# Patient Record
Sex: Female | Born: 1975 | Race: White | Hispanic: No | Marital: Married | Smoking: Current every day smoker
Health system: Southern US, Community
[De-identification: ages and names within clinical notes are randomized; demographics above are authoritative.]

## PROBLEM LIST (undated history)

## (undated) DIAGNOSIS — R87619 Unspecified abnormal cytological findings in specimens from cervix uteri: Secondary | ICD-10-CM

## (undated) DIAGNOSIS — Z8619 Personal history of other infectious and parasitic diseases: Secondary | ICD-10-CM

## (undated) HISTORY — DX: Personal history of other infectious and parasitic diseases: Z86.19

## (undated) HISTORY — DX: Unspecified abnormal cytological findings in specimens from cervix uteri: R87.619

## (undated) HISTORY — PX: DILATION AND CURETTAGE OF UTERUS: SHX78

## (undated) HISTORY — PX: WISDOM TOOTH EXTRACTION: SHX21

---

## 2015-05-16 ENCOUNTER — Encounter: Payer: Self-pay | Admitting: Obstetrics & Gynecology

## 2015-05-22 ENCOUNTER — Ambulatory Visit (INDEPENDENT_AMBULATORY_CARE_PROVIDER_SITE_OTHER): Payer: 59 | Admitting: Obstetrics & Gynecology

## 2015-05-22 ENCOUNTER — Encounter: Payer: Self-pay | Admitting: Obstetrics & Gynecology

## 2015-05-22 VITALS — BP 122/77 | HR 81 | Resp 16 | Ht 62.0 in | Wt 158.0 lb

## 2015-05-22 DIAGNOSIS — N949 Unspecified condition associated with female genital organs and menstrual cycle: Secondary | ICD-10-CM

## 2015-05-22 DIAGNOSIS — Z01419 Encounter for gynecological examination (general) (routine) without abnormal findings: Secondary | ICD-10-CM | POA: Diagnosis not present

## 2015-05-22 DIAGNOSIS — Z1151 Encounter for screening for human papillomavirus (HPV): Secondary | ICD-10-CM

## 2015-05-22 DIAGNOSIS — Z124 Encounter for screening for malignant neoplasm of cervix: Secondary | ICD-10-CM | POA: Diagnosis not present

## 2015-05-22 DIAGNOSIS — R229 Localized swelling, mass and lump, unspecified: Secondary | ICD-10-CM

## 2015-05-22 DIAGNOSIS — N9089 Other specified noninflammatory disorders of vulva and perineum: Secondary | ICD-10-CM | POA: Insufficient documentation

## 2015-05-22 DIAGNOSIS — Z30431 Encounter for routine checking of intrauterine contraceptive device: Secondary | ICD-10-CM | POA: Diagnosis not present

## 2015-05-22 DIAGNOSIS — Z113 Encounter for screening for infections with a predominantly sexual mode of transmission: Secondary | ICD-10-CM

## 2015-05-22 NOTE — Progress Notes (Signed)
  Subjective:     Christine Becker is a 39 y.o. female here for a routine exam.  Current complaints: vulvar/mons mass that causes irritation.  Patient happy with Mirena and barely spots a few times a year.  Not interested in childbearing.  History of abnormal pap in 2008.  Co testing today then q 3 years if nml.     Gynecologic History No LMP recorded. Patient is not currently having periods (Reason: IUD). Contraception: IUD Last Pap: 2014. Results were: normal Last mammogram: n/a.  Obstetric History OB History  Gravida Para Term Preterm AB SAB TAB Ectopic Multiple Living  $Remov'2 2 1 1      2    'DUYWdG$ # Outcome Date GA Lbr Len/2nd Weight Sex Delivery Anes PTL Lv  2 Preterm  [redacted]w[redacted]d  5 lb 9 oz (2.523 kg)  Vag-Spont     1 Term    7 lb 11 oz (3.487 kg)  Vag-Spont          The following portions of the patient's history were reviewed and updated as appropriate: allergies, current medications, past family history, past medical history, past social history, past surgical history and problem list.  Review of Systems A comprehensive review of systems was negative.    Objective:      Filed Vitals:   05/22/15 1504  BP: 122/77  Pulse: 81  Resp: 16  Height: $Remove'5\' 2"'qGTUCil$  (1.575 m)  Weight: 158 lb (71.668 kg)   Vitals:  WNL General appearance: alert, cooperative and no distress Becker: Normocephalic, without obvious abnormality, atraumatic Eyes: negative Throat: lips, mucosa, and tongue normal; teeth and gums normal Lungs: clear to auscultation bilaterally Breasts: normal appearance, no masses or tenderness, No nipple retraction or dimpling, No nipple discharge or bleeding Heart: regular rate and rhythm Abdomen: soft, non-tender; bowel sounds normal; no masses,  no organomegaly  Pelvic:  External Genitalia:  Tanner V, Vulvar lesion on right vulva/mons. Urethra:  No prolapse Vagina:  Pink, normal rugae, no blood or discharge Cervix:  No CMT, no lesion Uterus:  Normal size and contour, non tender Adnexa:   Normal, no masses, non tender  Extremities: no edema, redness or tenderness in the calves or thighs Skin: no lesions or rash Lymph nodes: Axillary adenopathy: none        Assessment:    Healthy female exam.   Vulvar mass, can be irritating at times fam history of breast cancer in multiple relatives   Plan:    Education reviewed: self breast exams. Contraception: IUD. Mammogram ordered. Follow up in: 3 weeks. Vulvar mass removal and BRCA testing

## 2015-05-22 NOTE — Addendum Note (Signed)
Addended by: Arne Cleveland on: 05/22/2015 04:03 PM   Modules accepted: Orders

## 2015-05-25 LAB — CYTOLOGY - PAP

## 2015-05-28 ENCOUNTER — Encounter: Payer: Self-pay | Admitting: Obstetrics & Gynecology

## 2015-05-28 DIAGNOSIS — B977 Papillomavirus as the cause of diseases classified elsewhere: Secondary | ICD-10-CM | POA: Insufficient documentation

## 2015-06-01 ENCOUNTER — Encounter: Payer: Self-pay | Admitting: *Deleted

## 2015-06-19 ENCOUNTER — Ambulatory Visit (INDEPENDENT_AMBULATORY_CARE_PROVIDER_SITE_OTHER): Payer: 59 | Admitting: Obstetrics & Gynecology

## 2015-06-19 ENCOUNTER — Encounter: Payer: Self-pay | Admitting: Obstetrics & Gynecology

## 2015-06-19 VITALS — BP 127/84 | HR 72 | Resp 16 | Ht 62.0 in | Wt 158.0 lb

## 2015-06-19 DIAGNOSIS — N949 Unspecified condition associated with female genital organs and menstrual cycle: Secondary | ICD-10-CM

## 2015-06-19 DIAGNOSIS — Z803 Family history of malignant neoplasm of breast: Secondary | ICD-10-CM | POA: Diagnosis not present

## 2015-06-19 DIAGNOSIS — N9089 Other specified noninflammatory disorders of vulva and perineum: Secondary | ICD-10-CM

## 2015-06-20 NOTE — Progress Notes (Signed)
   Subjective:    Patient ID: Christine Becker, female    DOB: 07/12/1976, 39 y.o.   MRN: [id number]  HPI  Pt presents for removal of a mass on her right mons pubis.  It intereres with shaving and wearing garmets.  It causes pain and discomfort. Pt also present for BRCA  Review of Systems  Constitutional: Negative.   Cardiovascular: Negative.   Gastrointestinal: Negative.   Genitourinary:       Pain and irritation at mons pubis  Psychiatric/Behavioral: Negative.        Objective:   Physical Exam  Constitutional: She appears well-developed and well-nourished.  Pulmonary/Chest: Effort normal.  Abdominal: Soft.  Genitourinary:     Skin: Skin is warm and dry.  Vitals reviewed.     Assessment & Plan:   39 you female for removal of mass on mons pubis  Area was cleaned with betadine 2 cc 1% lidocaine injected Scalpel used to remove mass Hemostasis achieve with 4-0 vicryl  BRCA testing

## 2015-06-26 ENCOUNTER — Telehealth: Payer: Self-pay | Admitting: *Deleted

## 2015-06-26 NOTE — Telephone Encounter (Signed)
Pt notified of pathology results

## 2015-06-26 NOTE — Telephone Encounter (Signed)
-----   Message from Lesly Dukes, MD sent at 06/25/2015  6:57 AM EDT ----- [facility].  RN TO [place] Dials

## 2015-06-29 ENCOUNTER — Telehealth: Payer: Self-pay | Admitting: *Deleted

## 2015-06-29 ENCOUNTER — Encounter: Payer: Self-pay | Admitting: *Deleted

## 2015-06-29 ENCOUNTER — Encounter: Payer: Self-pay | Admitting: Obstetrics & Gynecology

## 2015-06-29 DIAGNOSIS — Z803 Family history of malignant neoplasm of breast: Secondary | ICD-10-CM | POA: Insufficient documentation

## 2015-06-29 NOTE — Telephone Encounter (Signed)
Copy of My Risk neg results mailed to pt's home address.

## 2015-06-29 NOTE — Telephone Encounter (Signed)
-----   Message from Lesly Dukes, MD sent at 06/29/2015 11:55 AM EDT ----- My risk negative.  Rn to call patient.

## 2015-07-04 ENCOUNTER — Ambulatory Visit (INDEPENDENT_AMBULATORY_CARE_PROVIDER_SITE_OTHER): Payer: 59 | Admitting: Obstetrics & Gynecology

## 2015-07-04 ENCOUNTER — Encounter: Payer: Self-pay | Admitting: Obstetrics & Gynecology

## 2015-07-04 VITALS — BP 115/80 | HR 81 | Resp 16 | Ht 62.0 in | Wt 158.0 lb

## 2015-07-04 DIAGNOSIS — R102 Pelvic and perineal pain: Secondary | ICD-10-CM

## 2015-07-04 DIAGNOSIS — L918 Other hypertrophic disorders of the skin: Secondary | ICD-10-CM

## 2015-07-04 DIAGNOSIS — B977 Papillomavirus as the cause of diseases classified elsewhere: Secondary | ICD-10-CM | POA: Diagnosis not present

## 2015-07-04 DIAGNOSIS — N949 Unspecified condition associated with female genital organs and menstrual cycle: Secondary | ICD-10-CM

## 2015-07-04 DIAGNOSIS — N9089 Other specified noninflammatory disorders of vulva and perineum: Secondary | ICD-10-CM

## 2015-07-04 NOTE — Progress Notes (Signed)
   Subjective:    Patient ID: Christine Becker, female    DOB: 1976-07-21, 39 y.o.   MRN: [id number]  HPI  Patient here for test resutls and removal of stictch from polyp removal.  Pt has been having some pain at the site but no bleeding.  Nothing makes pain better or worse.  Pt has not tried any treatments.  There are no associated symptoms  Review of Systems  Constitutional: Negative.   Gastrointestinal: Negative.   Genitourinary: Negative.   Skin:       Pain at site of stitch, otherwise negative.       Objective:   Physical Exam  Constitutional: She appears well-developed and well-nourished. No distress.  Pulmonary/Chest: Effort normal.  Abdominal: Soft. She exhibits no distension. There is no tenderness.  Genitourinary:  Well healed incision from polyp removal   Skin: Skin is warm and dry.  Psychiatric: She has a normal mood and affect.  Vitals reviewed.         Assessment & Plan:  39 yo female with well healed site from polyp removal.  MRRISK came back negtaive.  Needs routine screening with mammogram in one month. 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 Pap in one year.

## 2015-12-20 ENCOUNTER — Other Ambulatory Visit: Payer: Self-pay | Admitting: Obstetrics & Gynecology

## 2015-12-20 DIAGNOSIS — Z1231 Encounter for screening mammogram for malignant neoplasm of breast: Secondary | ICD-10-CM

## 2016-05-20 DIAGNOSIS — Z720 Tobacco use: Secondary | ICD-10-CM | POA: Insufficient documentation

## 2016-05-20 DIAGNOSIS — K5732 Diverticulitis of large intestine without perforation or abscess without bleeding: Secondary | ICD-10-CM | POA: Insufficient documentation

## 2017-02-24 ENCOUNTER — Telehealth: Payer: Self-pay | Admitting: *Deleted

## 2017-02-24 NOTE — Telephone Encounter (Signed)
-----   Message from Lesly DukesKelly H Leggett, MD sent at 02/24/2017  2:49 PM EDT ----- Pt was to get a mammogram and Pap in 2017.  Please call patient and make sure she is getting these test somewhere.  I don't see in Epic.  Make note in chart.  Pap smear is very important for this patient given last test result.

## 2017-02-24 NOTE — Telephone Encounter (Signed)
LM on voicemail to call office

## 2017-02-27 ENCOUNTER — Encounter: Payer: Self-pay | Admitting: *Deleted

## 2017-02-27 ENCOUNTER — Telehealth: Payer: Self-pay | Admitting: *Deleted

## 2017-02-27 NOTE — Telephone Encounter (Signed)
Pt has not returned my call about her overdue pap or mammogram.  Certified letter mailed to pt .  May review in pt chart.

## 2017-06-17 ENCOUNTER — Encounter: Payer: Self-pay | Admitting: Obstetrics & Gynecology

## 2017-06-17 ENCOUNTER — Ambulatory Visit (INDEPENDENT_AMBULATORY_CARE_PROVIDER_SITE_OTHER): Payer: 59 | Admitting: Obstetrics & Gynecology

## 2017-06-17 VITALS — BP 126/88 | HR 82 | Resp 16 | Ht 62.0 in | Wt 168.0 lb

## 2017-06-17 DIAGNOSIS — Z1151 Encounter for screening for human papillomavirus (HPV): Secondary | ICD-10-CM

## 2017-06-17 DIAGNOSIS — Z30430 Encounter for insertion of intrauterine contraceptive device: Secondary | ICD-10-CM | POA: Diagnosis not present

## 2017-06-17 DIAGNOSIS — Z8619 Personal history of other infectious and parasitic diseases: Secondary | ICD-10-CM

## 2017-06-17 DIAGNOSIS — Z3202 Encounter for pregnancy test, result negative: Secondary | ICD-10-CM

## 2017-06-17 DIAGNOSIS — Z01419 Encounter for gynecological examination (general) (routine) without abnormal findings: Secondary | ICD-10-CM

## 2017-06-17 DIAGNOSIS — Z124 Encounter for screening for malignant neoplasm of cervix: Secondary | ICD-10-CM | POA: Diagnosis not present

## 2017-06-17 LAB — POCT URINE PREGNANCY: PREG TEST UR: NEGATIVE

## 2017-06-17 MED ORDER — LEVONORGESTREL 20 MCG/24HR IU IUD
INTRAUTERINE_SYSTEM | Freq: Once | INTRAUTERINE | Status: AC
  Administered 2017-06-17: 17:00:00 via INTRAUTERINE

## 2017-06-19 NOTE — Progress Notes (Signed)
Subjective:     Christine Becker is a 40 y.o. female here for a routine exam.  Current complaints: none.  Doing vey well with Mirena.  Wants another inserted today.  Pt is happy--husband just adopted her youngest child!.    Gynecologic History No LMP recorded. Patient is not currently having periods (Reason: IUD). Contraception: IUD Last Pap: 2016. Results were: normal Last mammogram: never  (BRCA negative).   Obstetric History OB History  Gravida Para Term Preterm AB Living  _0 SAB TAB Ectopic Multiple Live Births               # Outcome Date GA Lbr Len/2nd Weight Sex Delivery Anes PTL Lv  2 Preterm  [redacted]w[redacted]d 5 lb 9 oz (2.523 kg)  Vag-Spont     1 Term    7 lb 11 oz (3.487 kg)  Vag-Spont          The following portions of the patient's history were reviewed and updated as appropriate: allergies, current medications, past family history, past medical history, past social history, past surgical history and problem list.  Review of Systems Pertinent items noted in HPI and remainder of comprehensive ROS otherwise negative.    Objective:      Vitals:   06/17/17 1608  BP: 126/88  Pulse: 82  Resp: 16  Weight: 168 lb (76.2 kg)  Height: 5' 2" (1.575 m)   Vitals:  WNL General appearance: alert, cooperative and no distress  HEENT: Normocephalic, without obvious abnormality, atraumatic Eyes: negative Throat: lips, mucosa, and tongue normal; teeth and gums normal  Respiratory: Clear to auscultation bilaterally  CV: Regular rate and rhythm  Breasts:  Normal appearance, no masses or tenderness, no nipple retraction or dimpling  GI: Soft, non-tender; bowel sounds normal; no masses,  no organomegaly  GU: External Genitalia:  Tanner V, no lesion Urethra:  No prolapse   Vagina: Pink, normal rugae, no blood or discharge  Cervix: No CMT, no lesion  Uterus:  Normal size and contour, non tender  Adnexa: Normal, no masses, non tender  Musculoskeletal: No edema, redness or tenderness  in the calves or thighs  Skin: No lesions or rash  Lymphatic: Axillary adenopathy: none     Psychiatric: Normal mood and behavior        Assessment:    Healthy female exam.    Plan:    Contraception: IUD and  . Mammogram ordered. Follow up in: 6 weeks. string check   GYNECOLOGY CLINIC PROCEDURE NOTE  Christine MEspinalis a 41y.o. [id number] for Mirena IUD removal and reinsertion. No GYN concerns.  Last pap smear was on 2016 and was normal.  IUD Removal and Reinsertion  Patient identified, informed consent performed. Discussed risks of irregular bleeding, cramping, infection, malpositioning or misplacement of the IUD outside the uterus which may require further procedures. Time out was performed. Speculum placed in the vagina. Cervix visualized. Cleaned with Betadine x 2. Grasped anteriorly with a single tooth tenaculum. The strings of the IUD were grasped and pulled using ring forceps. The IUD was successfully removed in its entirety. Uterus sounded to 7 cm. Mirena IUD placed per manufacturer's recommendations. Strings trimmed to 2-3 cm. Tenaculum was removed, good hemostasis noted. Patient tolerated procedure well. Patient was given post-procedure instructions.  Patient was also asked to check IUD strings periodically and follow up in 6 weeks for IUD check.

## 2017-06-20 LAB — CYTOLOGY - PAP
Diagnosis: NEGATIVE
HPV: DETECTED — AB

## 2017-06-27 ENCOUNTER — Telehealth: Payer: Self-pay | Admitting: *Deleted

## 2017-06-27 NOTE — Telephone Encounter (Signed)
-----   Message from Lesly Dukes, MD sent at 06/24/2017  8:40 PM EDT ----- Pap is nml but HPV is positive; needs colposcopy

## 2017-06-27 NOTE — Telephone Encounter (Signed)
LM to call office concerning pap smear results.  Pt will be needing a colposcopy.

## 2018-09-08 NOTE — Progress Notes (Signed)
Pt has not kept appointment for screening mammogram for 2 years.  RN to call and see if she woul ike for us to order again.

## 2018-09-09 ENCOUNTER — Encounter: Payer: Self-pay | Admitting: *Deleted

## 2018-09-17 ENCOUNTER — Telehealth: Payer: Self-pay | Admitting: *Deleted

## 2018-09-17 NOTE — Telephone Encounter (Signed)
-----   Message from Lesly DukesKelly H Leggett, MD sent at 09/08/2018  2:26 PM EST ----- Call patient and see why she is not getting mammograms.  Reorder if she will go (note inchart, too)

## 2018-09-17 NOTE — Telephone Encounter (Signed)
Unable to reach pt via phone so letter reminding her that she is overdue for her mammogram and the importance of getting one mailed to her home address.  Encouraged pt to call if she has gotten one from another facility.

## 2018-11-24 ENCOUNTER — Other Ambulatory Visit: Payer: Self-pay | Admitting: Obstetrics & Gynecology

## 2018-11-24 DIAGNOSIS — Z1231 Encounter for screening mammogram for malignant neoplasm of breast: Secondary | ICD-10-CM

## 2018-11-27 ENCOUNTER — Ambulatory Visit (INDEPENDENT_AMBULATORY_CARE_PROVIDER_SITE_OTHER): Payer: 59

## 2018-11-27 DIAGNOSIS — Z1231 Encounter for screening mammogram for malignant neoplasm of breast: Secondary | ICD-10-CM | POA: Diagnosis not present

## 2018-12-22 ENCOUNTER — Telehealth: Payer: Self-pay | Admitting: *Deleted

## 2018-12-22 NOTE — Telephone Encounter (Signed)
Left patient message x2 to call or MyChart message the office to cancel her appointment on 12/28/2018 at 8:30am due to COVID-19.

## 2018-12-25 ENCOUNTER — Telehealth: Payer: Self-pay

## 2018-12-25 NOTE — Telephone Encounter (Signed)
Left message on pt's VM letting her know we need to cancel her annual appt on 4/6 due to COVID-19.

## 2018-12-28 ENCOUNTER — Ambulatory Visit: Payer: 59 | Admitting: Obstetrics & Gynecology

## 2019-03-11 ENCOUNTER — Telehealth: Payer: Self-pay | Admitting: *Deleted

## 2019-03-11 NOTE — Telephone Encounter (Signed)
Left patient a message to call and reschedule cancelled Annual appointment on 12/28/2018 due to COVID-19.

## 2019-06-21 ENCOUNTER — Other Ambulatory Visit: Payer: Self-pay

## 2019-06-21 ENCOUNTER — Ambulatory Visit (INDEPENDENT_AMBULATORY_CARE_PROVIDER_SITE_OTHER): Payer: 59 | Admitting: Obstetrics & Gynecology

## 2019-06-21 ENCOUNTER — Encounter: Payer: Self-pay | Admitting: Obstetrics & Gynecology

## 2019-06-21 VITALS — BP 137/94 | HR 83 | Ht 62.0 in | Wt 160.0 lb

## 2019-06-21 DIAGNOSIS — Z124 Encounter for screening for malignant neoplasm of cervix: Secondary | ICD-10-CM

## 2019-06-21 DIAGNOSIS — Z1151 Encounter for screening for human papillomavirus (HPV): Secondary | ICD-10-CM

## 2019-06-21 DIAGNOSIS — Z01419 Encounter for gynecological examination (general) (routine) without abnormal findings: Secondary | ICD-10-CM | POA: Diagnosis not present

## 2019-06-21 DIAGNOSIS — Z0000 Encounter for general adult medical examination without abnormal findings: Secondary | ICD-10-CM

## 2019-06-21 NOTE — Progress Notes (Signed)
Last pap- 06/17/17- abnormal Last mammogram- 11/30/2018- normal

## 2019-06-21 NOTE — Progress Notes (Signed)
Subjective:     Christine Becker is a 43 y.o. female here for a routine exam.  Current complaints: none.  Pt doing well with IUD.  Pt needs annual physcial exam for work, labs, and flu shot.     Gynecologic History No LMP recorded. (Menstrual status: IUD). Contraception: IUD Last Pap: 2018. Results were: abnormal--nml cytoloty and +HPV--Covid interrupted follow up in March. Last mammogram: 3/20. Results were: normal  Obstetric History OB History  Gravida Para Term Preterm AB Living  2 2 1 1   2   SAB TAB Ectopic Multiple Live Births               # Outcome Date GA Lbr Len/2nd Weight Sex Delivery Anes PTL Lv  2 Preterm  [redacted]w[redacted]d  5 lb 9 oz (2.523 kg)  Vag-Spont     1 Term    7 lb 11 oz (3.487 kg)  Vag-Spont        The following portions of the patient's history were reviewed and updated as appropriate: allergies, current medications, past family history, past medical history, past social history, past surgical history and problem list.  Review of Systems Pertinent items noted in HPI and remainder of comprehensive ROS otherwise negative.    Objective:      Vitals:   06/21/19 1348  BP: (!) 137/94  Pulse: 83  Weight: 160 lb (72.6 kg)  Height: 5\' 2"  (1.575 m)   Vitals:  WNL General appearance: alert, cooperative and no distress  HEENT: Normocephalic, without obvious abnormality, atraumatic Eyes: negative Throat: lips, mucosa, and tongue normal; teeth and gums normal  Respiratory: Clear to auscultation bilaterally  CV: Regular rate and rhythm  Breasts:  Normal appearance, no masses or tenderness, no nipple retraction or dimpling  GI: Soft, non-tender; bowel sounds normal; no masses,  no organomegaly  GU: External Genitalia:  Tanner V, no lesion Urethra:  No prolapse   Vagina: Pink, normal rugae, no blood, +white discharge  Cervix: No CMT, no lesion IUD strings felt, bleeding with pap  Uterus:  Normal size and contour, non tender  Adnexa: Normal, no masses, non tender   Musculoskeletal: No edema, redness or tenderness in the calves or thighs  Skin: No lesions or rash  Lymphatic: Axillary adenopathy: none     Psychiatric: Normal mood and behavior        Assessment:    Healthy female exam.   Yearly physcial performed today.   Plan:    Pap with co testing Mammogram yearly, my risk negative Labs--TSH, Vit D, CMP, CBC, Cholesterol panel,  Flu shot

## 2019-06-21 NOTE — Addendum Note (Signed)
Addended by: Lyndal Rainbow on: 06/21/2019 02:25 PM   Modules accepted: Orders

## 2019-06-22 ENCOUNTER — Ambulatory Visit (INDEPENDENT_AMBULATORY_CARE_PROVIDER_SITE_OTHER): Payer: 59 | Admitting: *Deleted

## 2019-06-22 DIAGNOSIS — Z01419 Encounter for gynecological examination (general) (routine) without abnormal findings: Secondary | ICD-10-CM

## 2019-06-22 DIAGNOSIS — Z23 Encounter for immunization: Secondary | ICD-10-CM | POA: Diagnosis not present

## 2019-06-22 NOTE — Progress Notes (Signed)
Pt here for fasting labs and Flu vaccine only

## 2019-06-23 ENCOUNTER — Other Ambulatory Visit: Payer: Self-pay | Admitting: Obstetrics & Gynecology

## 2019-06-23 ENCOUNTER — Encounter: Payer: Self-pay | Admitting: Obstetrics & Gynecology

## 2019-06-23 DIAGNOSIS — E559 Vitamin D deficiency, unspecified: Secondary | ICD-10-CM | POA: Insufficient documentation

## 2019-06-23 LAB — COMPREHENSIVE METABOLIC PANEL
AG Ratio: 1.9 (calc) (ref 1.0–2.5)
ALT: 13 U/L (ref 6–29)
AST: 14 U/L (ref 10–30)
Albumin: 4.2 g/dL (ref 3.6–5.1)
Alkaline phosphatase (APISO): 87 U/L (ref 31–125)
BUN: 7 mg/dL (ref 7–25)
CO2: 26 mmol/L (ref 20–32)
Calcium: 9.6 mg/dL (ref 8.6–10.2)
Chloride: 104 mmol/L (ref 98–110)
Creat: 0.81 mg/dL (ref 0.50–1.10)
Globulin: 2.2 g/dL (calc) (ref 1.9–3.7)
Glucose, Bld: 82 mg/dL (ref 65–99)
Potassium: 4.7 mmol/L (ref 3.5–5.3)
Sodium: 136 mmol/L (ref 135–146)
Total Bilirubin: 0.7 mg/dL (ref 0.2–1.2)
Total Protein: 6.4 g/dL (ref 6.1–8.1)

## 2019-06-23 LAB — CBC
HCT: 45.4 % — ABNORMAL HIGH (ref 35.0–45.0)
Hemoglobin: 15.2 g/dL (ref 11.7–15.5)
MCH: 32.8 pg (ref 27.0–33.0)
MCHC: 33.5 g/dL (ref 32.0–36.0)
MCV: 98.1 fL (ref 80.0–100.0)
MPV: 11.1 fL (ref 7.5–12.5)
Platelets: 270 10*3/uL (ref 140–400)
RBC: 4.63 10*6/uL (ref 3.80–5.10)
RDW: 12.1 % (ref 11.0–15.0)
WBC: 5.6 10*3/uL (ref 3.8–10.8)

## 2019-06-23 LAB — LIPID PANEL
Cholesterol: 139 mg/dL (ref <200)
HDL: 49 mg/dL — ABNORMAL LOW (ref >=50)
LDL Cholesterol (Calc): 77 mg/dL (calc)
Non-HDL Cholesterol (Calc): 90 mg/dL (calc) (ref <130)
Total CHOL/HDL Ratio: 2.8 (calc) (ref <5.0)
Triglycerides: 58 mg/dL (ref <150)

## 2019-06-23 LAB — TSH: TSH: 2.15 mIU/L

## 2019-06-23 LAB — VITAMIN D 25 HYDROXY (VIT D DEFICIENCY, FRACTURES): Vit D, 25-Hydroxy: 17 ng/mL — ABNORMAL LOW (ref 30–100)

## 2019-06-23 MED ORDER — VITAMIN D (ERGOCALCIFEROL) 1.25 MG (50000 UNIT) PO CAPS: 50000.0000 [IU] | ORAL_CAPSULE | ORAL | 1 refills | Status: DC

## 2019-06-23 NOTE — Progress Notes (Signed)
Vitamin D 50K units weekly for 6 weeks and redraw level.

## 2019-06-28 LAB — CYTOLOGY - PAP
Diagnosis: NEGATIVE
HPV 16: NEGATIVE
HPV 18 / 45: NEGATIVE
High risk HPV: POSITIVE — AB

## 2019-07-05 ENCOUNTER — Encounter: Payer: Self-pay | Admitting: Obstetrics & Gynecology

## 2019-07-05 ENCOUNTER — Ambulatory Visit (INDEPENDENT_AMBULATORY_CARE_PROVIDER_SITE_OTHER): Payer: 59 | Admitting: Obstetrics & Gynecology

## 2019-07-05 ENCOUNTER — Other Ambulatory Visit: Payer: Self-pay

## 2019-07-05 VITALS — BP 135/87 | HR 77 | Resp 16 | Ht 62.0 in | Wt 161.0 lb

## 2019-07-05 DIAGNOSIS — N870 Mild cervical dysplasia: Secondary | ICD-10-CM

## 2019-07-05 DIAGNOSIS — Z3202 Encounter for pregnancy test, result negative: Secondary | ICD-10-CM | POA: Diagnosis not present

## 2019-07-05 DIAGNOSIS — R87810 Cervical high risk human papillomavirus (HPV) DNA test positive: Secondary | ICD-10-CM

## 2019-07-05 LAB — POCT URINE PREGNANCY: Preg Test, Ur: NEGATIVE

## 2019-07-05 NOTE — Progress Notes (Signed)
Colposcopy Procedure Note  Indications: Pap smear 1 months ago showed: ASCUS with POSITIVE high risk HPV. The prior pap showed ASCUS with POSITIVE high risk HPV.   Pt was to have colposcopy in 2018 but did not call back to schedule (VM left by RN clark on 06/27/17).  Rpt pap in Septemer 2020 showed nml cytology with +HPV.  Pt here for colposcopy today.  Prior cervical/vaginal disease: none other than above. Prior cervical treatment: no treatment.  Procedure Details  The risks and benefits of the procedure and Written informed consent obtained.  Speculum placed in vagina and excellent visualization of cervix achieved, cervix swabbed x 3 with acetic acid solution.  Findings: Cervix: acetowhite lesion(s) noted at 5, 7, 10, and 1 o'clock; cervix swabbed with Lugol's solution, SCJ visualized - lesion at 5, 7, 10, and 1 o'clock and endocervical curettage performed. Vaginal inspection: vaginal colposcopy not performed. Vulvar colposcopy: vulvar colposcopy not performed.  Specimens: 5, 7, 10, and 1 o'clock biopsies and ECC  Complications: none.  Plan: Specimens labelled and sent to Pathology. Will base further treatment on Pathology findings. Post biopsy instructions given to patient.

## 2019-07-15 ENCOUNTER — Encounter: Payer: Self-pay | Admitting: *Deleted

## 2020-04-05 ENCOUNTER — Other Ambulatory Visit: Payer: Self-pay | Admitting: Obstetrics & Gynecology

## 2020-04-05 DIAGNOSIS — Z1231 Encounter for screening mammogram for malignant neoplasm of breast: Secondary | ICD-10-CM

## 2020-04-06 ENCOUNTER — Other Ambulatory Visit: Payer: Self-pay

## 2020-04-06 ENCOUNTER — Ambulatory Visit (INDEPENDENT_AMBULATORY_CARE_PROVIDER_SITE_OTHER): Payer: 59

## 2020-04-06 DIAGNOSIS — Z1231 Encounter for screening mammogram for malignant neoplasm of breast: Secondary | ICD-10-CM | POA: Diagnosis not present

## 2020-07-17 ENCOUNTER — Encounter: Payer: Self-pay | Admitting: Obstetrics & Gynecology

## 2020-07-17 ENCOUNTER — Ambulatory Visit (INDEPENDENT_AMBULATORY_CARE_PROVIDER_SITE_OTHER): Payer: 59 | Admitting: Obstetrics & Gynecology

## 2020-07-17 ENCOUNTER — Other Ambulatory Visit: Payer: Self-pay

## 2020-07-17 ENCOUNTER — Other Ambulatory Visit (HOSPITAL_COMMUNITY)
Admission: RE | Admit: 2020-07-17 | Discharge: 2020-07-17 | Disposition: A | Discharge status: Home / Self Care | Payer: 59 | Source: Ambulatory Visit | Attending: Obstetrics & Gynecology | Admitting: Obstetrics & Gynecology

## 2020-07-17 VITALS — BP 149/99 | HR 85 | Ht 62.0 in | Wt 165.0 lb

## 2020-07-17 DIAGNOSIS — Z01419 Encounter for gynecological examination (general) (routine) without abnormal findings: Secondary | ICD-10-CM

## 2020-07-17 NOTE — Progress Notes (Signed)
Last pap- 06/21/19- Negative with High Risk HPV Last mammogram- 04/06/20- normal Pt has appt with PCP on Wednesday- will address BP

## 2020-07-17 NOTE — Progress Notes (Signed)
Subjective:     Christine Becker is a 44 y.o. female here for a routine exam.  Current complaints: none.  Happy with IUD Gynecologic History No LMP recorded. (Menstrual status: IUD). Contraception: IUD Last Pap: 2020. Results were: abnormal Last mammogram: 03/2020. Results were: normal  Obstetric History OB History  Gravida Para Term Preterm AB Living  2 2 1 1   2   SAB TAB Ectopic Multiple Live Births               # Outcome Date GA Lbr Len/2nd Weight Sex Delivery Anes PTL Lv  2 Preterm  [redacted]w[redacted]d  5 lb 9 oz (2.523 kg)  Vag-Spont     1 Term    7 lb 11 oz (3.487 kg)  Vag-Spont        The following portions of the patient's history were reviewed and updated as appropriate: allergies, current medications, past family history, past medical history, past social history, past surgical history and problem list.  Review of Systems Pertinent items noted in HPI and remainder of comprehensive ROS otherwise negative.    Objective:      Vitals:   07/17/20 1543  BP: (!) 149/99  Pulse: 85  Weight: 165 lb (74.8 kg)  Height: 5\' 2"  (1.575 m)   Vitals:  WNL General appearance: alert, cooperative and no distress  HEENT: Normocephalic, without obvious abnormality, atraumatic Eyes: negative Throat: lips, mucosa, and tongue normal; teeth and gums normal  Respiratory: Clear to auscultation bilaterally  CV: Regular rate and rhythm  Breasts:  Normal appearance, no masses or tenderness, no nipple retraction or dimpling  GI: Soft, non-tender; bowel sounds normal; no masses,  no organomegaly  GU: External Genitalia:  Tanner V, no lesion Urethra:  No prolapse   Vagina: Pink, normal rugae, scant blood coming from cervix  Cervix: No CMT, no lesion, IUD strings seen  Uterus:  Normal size and contour, non tender  Adnexa: Normal, no masses, non tender  Musculoskeletal: No edema, redness or tenderness in the calves or thighs  Skin: No lesions or rash  Lymphatic: Axillary adenopathy: none     Psychiatric:  Normal mood and behavior        Assessment:    Healthy female exam.   CIN on Pap last year   Plan:   1.  Pap with cotesting 2.  Yearly mammograms 3.  Reviewed new colon screening guidelines 4.  Smoking cessation 5.  Has appt with PCP tomorrow for HTN.

## 2020-07-19 LAB — CYTOLOGY - PAP
Comment: NEGATIVE
Diagnosis: NEGATIVE
High risk HPV: NEGATIVE

## 2020-07-20 ENCOUNTER — Ambulatory Visit: Payer: 59 | Admitting: Family Medicine

## 2020-07-20 MED ORDER — HYDROMORPHONE HCL 1 MG/ML IJ SOLN
INTRAMUSCULAR | Status: AC
  Filled 2020-07-20: qty 1

## 2022-01-14 ENCOUNTER — Other Ambulatory Visit (HOSPITAL_BASED_OUTPATIENT_CLINIC_OR_DEPARTMENT_OTHER): Payer: Self-pay | Admitting: Obstetrics & Gynecology

## 2022-01-14 DIAGNOSIS — Z1231 Encounter for screening mammogram for malignant neoplasm of breast: Secondary | ICD-10-CM

## 2022-01-17 ENCOUNTER — Ambulatory Visit (INDEPENDENT_AMBULATORY_CARE_PROVIDER_SITE_OTHER): Payer: 59

## 2022-01-17 DIAGNOSIS — Z1231 Encounter for screening mammogram for malignant neoplasm of breast: Secondary | ICD-10-CM | POA: Diagnosis not present

## 2022-02-22 ENCOUNTER — Encounter: Payer: Self-pay | Admitting: Family Medicine

## 2022-02-22 ENCOUNTER — Ambulatory Visit (INDEPENDENT_AMBULATORY_CARE_PROVIDER_SITE_OTHER): Payer: 59 | Admitting: Family Medicine

## 2022-02-22 DIAGNOSIS — Z720 Tobacco use: Secondary | ICD-10-CM

## 2022-02-22 MED ORDER — CHANTIX STARTING MONTH PAK 0.5 MG X 11 & 1 MG X 42 PO TBPK: ORAL_TABLET | ORAL | 0 refills | Status: DC

## 2022-02-22 MED ORDER — VARENICLINE TARTRATE 1 MG PO TABS: 1.0000 mg | ORAL_TABLET | Freq: Two times a day (BID) | ORAL | 2 refills | Status: DC

## 2022-02-22 NOTE — Patient Instructions (Signed)

## 2022-02-24 NOTE — Assessment & Plan Note (Signed)
We discussed options for smoking cessation.  All of those options she is interested in trying Chantix.  Starter pack as well as continuing pack sent in.  Side effects as well as black box warning of potential for suicidal ideation reviewed.

## 2022-02-24 NOTE — Progress Notes (Signed)
Christine Becker - 46 y.o. female MRN [id number]  Date of birth: 02/04/1976  Subjective Chief Complaint  Patient presents with   Establish Care    HPI Christine Becker is a 46 year old female here today for initial visit to establish care.  Overall she has been in fairly good health.  She is seeing GYN on a regular basis and has had history of abnormal Pap smear.  She is a daily smoker which increased after the passing of her father last year.  She is interested in quitting.  She does feel like she has been able to take time to process the death of her father over the past year and now is a good time to try to quit.  She has tried patches in the past without success.  She has never tried any prescription medications.  ROS:  A comprehensive ROS was completed and negative except as noted per HPI  No Known Allergies  Past Medical History:  Diagnosis Date   Abnormal Pap smear of cervix    repeat pap WNL   History of HPV infection     Past Surgical History:  Procedure Laterality Date   DILATION AND CURETTAGE OF UTERUS     bleeding   WISDOM TOOTH EXTRACTION      Social History   Socioeconomic History   Marital status: Married    Spouse name: Not on file   Number of children: Not on file   Years of education: Not on file   Highest education level: Not on file  Occupational History   Occupation: HR  Tobacco Use   Smoking status: Every Day    Packs/day: 0.50    Years: 10.00    Pack years: 5.00    Types: Cigarettes   Smokeless tobacco: Never  Substance and Sexual Activity   Alcohol use: Yes   Drug use: No   Sexual activity: Yes    Partners: Male    Birth control/protection: I.U.D.  Other Topics Concern   Not on file  Social History Narrative   Not on file   Social Determinants of Health   Financial Resource Strain: Not on file  Food Insecurity: Not on file  Transportation Needs: Not on file  Physical Activity: Not on file  Stress: Not on file  Social Connections: Not on file     Family History  Problem Relation Age of Onset   Diabetes Father    Heart failure Father    Obesity Father    Hyperlipidemia Mother    Obesity Mother    Cancer Paternal Grandmother        breast   Breast cancer Paternal Grandmother    Stroke Paternal Grandmother    Cancer Paternal Aunt        breast   Cancer Paternal Aunt        breast   Cancer Paternal Aunt        breast   Cancer [place] American cousin   Breast cancer Cousin     Health Maintenance  Topic Date Due   HIV Screening  Never done   Hepatitis C Screening  Never done   COVID-19 Vaccine (3 - Booster for [place] series) 03/20/2020   COLONOSCOPY (Pts 45-13yrs Insurance coverage will need to be confirmed)  Never done   INFLUENZA VACCINE  04/23/2022   PAP SMEAR-Modifier  07/18/2023   TETANUS/TDAP  05/20/2026   HPV VACCINES  Aged Out     ----------------------------------------------------------------------------------------------------------------------------------------------------------------------------------------------------------------- Physical Exam BP  125/84 (BP Location: Left Arm, Patient Position: Sitting, Cuff Size: Normal)   Pulse (!) 102   Ht 5' 1.75" (1.568 m)   Wt 181 lb (82.1 kg)   SpO2 96%   BMI 33.37 kg/m   Physical Exam Constitutional:      Appearance: Normal appearance.  Eyes:     General: No scleral icterus. Cardiovascular:     Rate and Rhythm: Normal rate and regular rhythm.  Pulmonary:     Effort: Pulmonary effort is normal.     Breath sounds: Normal breath sounds. No rales.  Musculoskeletal:     Cervical back: Neck supple.  Neurological:     General: No focal deficit present.     Mental Status: She is alert.  Psychiatric:        Mood and Affect: Mood normal.        Behavior: Behavior normal.     ------------------------------------------------------------------------------------------------------------------------------------------------------------------------------------------------------------------- Assessment and Plan  Tobacco use We discussed options for smoking cessation.  All of those options she is interested in trying Chantix.  Starter pack as well as continuing pack sent in.  Side effects as well as black box warning of potential for suicidal ideation reviewed.     Meds ordered this encounter  Medications   Varenicline Tartrate, Starter, (CHANTIX STARTING MONTH PAK) 0.5 MG X 11 & 1 MG X 42 TBPK    Sig: Take as directed on packaging.    Dispense:  53 each    Refill:  0   varenicline (CHANTIX CONTINUING MONTH PAK) 1 MG tablet    Sig: Take 1 tablet (1 mg total) by mouth 2 (two) times daily. Continue after completion of starter pack    Dispense:  60 tablet    Refill:  2    Return for Please schedule for annual exam/Fasting labs.    This visit occurred during the SARS-CoV-2 public health emergency.  Safety protocols were in place, including screening questions prior to the visit, additional usage of staff PPE, and extensive cleaning of exam room while observing appropriate contact time as indicated for disinfecting solutions.

## 2022-04-22 ENCOUNTER — Other Ambulatory Visit: Payer: 59

## 2022-04-22 ENCOUNTER — Other Ambulatory Visit: Payer: Self-pay | Admitting: Family Medicine

## 2022-04-22 DIAGNOSIS — E559 Vitamin D deficiency, unspecified: Secondary | ICD-10-CM

## 2022-04-22 DIAGNOSIS — Z0000 Encounter for general adult medical examination without abnormal findings: Secondary | ICD-10-CM

## 2022-04-22 DIAGNOSIS — Z13220 Encounter for screening for lipoid disorders: Secondary | ICD-10-CM

## 2022-04-23 LAB — CBC WITH DIFFERENTIAL/PLATELET
Absolute Monocytes: 407 cells/uL (ref 200–950)
Basophils Absolute: 37 cells/uL (ref 0–200)
Basophils Relative: 0.5 %
Eosinophils Absolute: 126 cells/uL (ref 15–500)
Eosinophils Relative: 1.7 %
HCT: 43.2 % (ref 35.0–45.0)
Hemoglobin: 14.5 g/dL (ref 11.7–15.5)
Lymphs Abs: 1524 cells/uL (ref 850–3900)
MCH: 33 pg (ref 27.0–33.0)
MCHC: 33.6 g/dL (ref 32.0–36.0)
MCV: 98.4 fL (ref 80.0–100.0)
MPV: 10.4 fL (ref 7.5–12.5)
Monocytes Relative: 5.5 %
Neutro Abs: 5306 cells/uL (ref 1500–7800)
Neutrophils Relative %: 71.7 %
Platelets: 297 10*3/uL (ref 140–400)
RBC: 4.39 10*6/uL (ref 3.80–5.10)
RDW: 12.4 % (ref 11.0–15.0)
Total Lymphocyte: 20.6 %
WBC: 7.4 10*3/uL (ref 3.8–10.8)

## 2022-04-23 LAB — LIPID PANEL W/REFLEX DIRECT LDL
Cholesterol: 157 mg/dL (ref <200)
HDL: 44 mg/dL — ABNORMAL LOW (ref >=50)
LDL Cholesterol (Calc): 92 mg/dL (calc)
Non-HDL Cholesterol (Calc): 113 mg/dL (calc) (ref <130)
Total CHOL/HDL Ratio: 3.6 (calc) (ref <5.0)
Triglycerides: 110 mg/dL (ref <150)

## 2022-04-23 LAB — COMPLETE METABOLIC PANEL WITH GFR
AG Ratio: 1.6 (calc) (ref 1.0–2.5)
ALT: 14 U/L (ref 6–29)
AST: 12 U/L (ref 10–35)
Albumin: 4 g/dL (ref 3.6–5.1)
Alkaline phosphatase (APISO): 91 U/L (ref 31–125)
BUN: 12 mg/dL (ref 7–25)
CO2: 26 mmol/L (ref 20–32)
Calcium: 9.3 mg/dL (ref 8.6–10.2)
Chloride: 105 mmol/L (ref 98–110)
Creat: 0.85 mg/dL (ref 0.50–0.99)
Globulin: 2.5 g/dL (calc) (ref 1.9–3.7)
Glucose, Bld: 139 mg/dL (ref 65–139)
Potassium: 4.4 mmol/L (ref 3.5–5.3)
Sodium: 135 mmol/L (ref 135–146)
Total Bilirubin: 0.4 mg/dL (ref 0.2–1.2)
Total Protein: 6.5 g/dL (ref 6.1–8.1)
eGFR: 86 mL/min/{1.73_m2} (ref >=60)

## 2022-04-23 LAB — VITAMIN D 25 HYDROXY (VIT D DEFICIENCY, FRACTURES): Vit D, 25-Hydroxy: 28 ng/mL — ABNORMAL LOW (ref 30–100)

## 2022-04-23 LAB — TSH: TSH: 1.8 mIU/L

## 2022-04-24 ENCOUNTER — Encounter: Payer: 59 | Admitting: Family Medicine

## 2022-05-01 ENCOUNTER — Encounter: Payer: Self-pay | Admitting: Family Medicine

## 2022-05-01 ENCOUNTER — Ambulatory Visit (INDEPENDENT_AMBULATORY_CARE_PROVIDER_SITE_OTHER): Payer: 59 | Admitting: Family Medicine

## 2022-05-01 VITALS — BP 135/86 | HR 64 | Ht 61.75 in | Wt 183.0 lb

## 2022-05-01 DIAGNOSIS — Z0000 Encounter for general adult medical examination without abnormal findings: Secondary | ICD-10-CM | POA: Diagnosis not present

## 2022-05-01 DIAGNOSIS — Z1211 Encounter for screening for malignant neoplasm of colon: Secondary | ICD-10-CM | POA: Diagnosis not present

## 2022-05-01 MED ORDER — CHANTIX STARTING MONTH PAK 0.5 MG X 11 & 1 MG X 42 PO TBPK: ORAL_TABLET | ORAL | 0 refills | Status: AC

## 2022-05-01 MED ORDER — VARENICLINE TARTRATE 1 MG PO TABS: 1.0000 mg | ORAL_TABLET | Freq: Two times a day (BID) | ORAL | 2 refills | Status: AC

## 2022-05-01 NOTE — Progress Notes (Signed)
Christine Becker - 46 y.o. female MRN [id number]  Date of birth: June 16, 1976  Subjective No chief complaint on file.   HPI Christine Becker is a 46 year old female here today for annual exam.  She had labs completed prior to visit which we reviewed in detail today.  She does try to stay pretty active.  Feels like diet is pretty good most of the time.  Unfortunately she does continue to smoke.  She is interested in starting Chantix.  Would like this sent to her new pharmacy.  Tried to pick this up previously but was out of stock.  She does consume alcohol occasionally.    Immunizations are up-to-date.  She has never had colon cancer screening.  She is interested in Cologuard.  Review of Systems  Constitutional:  Negative for chills, fever, malaise/fatigue and weight loss.  HENT:  Negative for congestion, ear pain and sore throat.   Eyes:  Negative for blurred vision, double vision and pain.  Respiratory:  Negative for cough and shortness of breath.   Cardiovascular:  Negative for chest pain and palpitations.  Gastrointestinal:  Negative for abdominal pain, blood in stool, constipation, heartburn and nausea.  Genitourinary:  Negative for dysuria and urgency.  Musculoskeletal:  Negative for joint pain and myalgias.  Neurological:  Negative for dizziness and headaches.  Endo/Heme/Allergies:  Does not bruise/bleed easily.  Psychiatric/Behavioral:  Negative for depression. The patient is not nervous/anxious and does not have insomnia.       No Known Allergies  Past Medical History:  Diagnosis Date   Abnormal Pap smear of cervix    repeat pap WNL   History of HPV infection     Past Surgical History:  Procedure Laterality Date   DILATION AND CURETTAGE OF UTERUS     bleeding   WISDOM TOOTH EXTRACTION      Social History   Socioeconomic History   Marital status: Married    Spouse name: Not on file   Number of children: Not on file   Years of education: Not on file   Highest education  level: Not on file  Occupational History   Occupation: HR  Tobacco Use   Smoking status: Every Day    Packs/day: 0.50    Years: 10.00    Total pack years: 5.00    Types: Cigarettes   Smokeless tobacco: Never  Substance and Sexual Activity   Alcohol use: Yes   Drug use: No   Sexual activity: Yes    Partners: Male    Birth control/protection: I.U.D.  Other Topics Concern   Not on file  Social History Narrative   Not on file   Social Determinants of Health   Financial Resource Strain: Not on file  Food Insecurity: Not on file  Transportation Needs: Not on file  Physical Activity: Not on file  Stress: Not on file  Social Connections: Not on file    Family History  Problem Relation Age of Onset   Diabetes Father    Heart failure Father    Obesity Father    Hyperlipidemia Mother    Obesity Mother    Cancer Paternal Grandmother        breast   Breast cancer Paternal Grandmother    Stroke Paternal Grandmother    Cancer Paternal Aunt        breast   Cancer Paternal Aunt        breast   Cancer Paternal Aunt        breast   Cancer  Christine Becker cousin   Breast cancer Cousin     Health Maintenance  Topic Date Due   HIV Screening  Never done   Hepatitis C Screening  Never done   COVID-19 Vaccine (3 - Pfizer series) 03/20/2020   COLONOSCOPY (Pts 45-30yrs Insurance coverage will need to be confirmed)  Never done   INFLUENZA VACCINE  04/23/2022   PAP SMEAR-Modifier  07/18/2023   TETANUS/TDAP  05/20/2026   HPV VACCINES  Aged Out     ----------------------------------------------------------------------------------------------------------------------------------------------------------------------------------------------------------------- Physical Exam BP 135/86 (BP Location: Left Arm, Patient Position: Sitting, Cuff Size: Normal)   Pulse 64   Ht 5' 1.75" (1.568 m)   Wt 183 lb (83 kg)   SpO2 96%   BMI 33.74 kg/m   Physical Exam Constitutional:       General: She is not in acute distress. HENT:     Head: Normocephalic and atraumatic.     Right Ear: Tympanic membrane and ear canal normal.     Left Ear: Tympanic membrane and ear canal normal.     Nose: Nose normal.  Eyes:     General: No scleral icterus.    Conjunctiva/sclera: Conjunctivae normal.  Neck:     Thyroid: No thyromegaly.  Cardiovascular:     Rate and Rhythm: Normal rate and regular rhythm.     Heart sounds: Normal heart sounds.  Pulmonary:     Effort: Pulmonary effort is normal.     Breath sounds: Normal breath sounds.  Abdominal:     General: Bowel sounds are normal. There is no distension.     Palpations: Abdomen is soft.     Tenderness: There is no abdominal tenderness. There is no guarding.  Musculoskeletal:        General: Normal range of motion.     Cervical back: Normal range of motion and neck supple.  Lymphadenopathy:     Cervical: No cervical adenopathy.  Skin:    General: Skin is warm and dry.     Findings: No rash.  Neurological:     General: No focal deficit present.     Mental Status: She is alert and oriented to person, place, and time.     Cranial Nerves: No cranial nerve deficit.     Coordination: Coordination normal.  Psychiatric:        Mood and Affect: Mood normal.        Behavior: Behavior normal.     ------------------------------------------------------------------------------------------------------------------------------------------------------------------------------------------------------------------- Assessment and Plan  Well adult exam Well adult Recent labs reviewed with her. Screenings: Cologuard ordered Immunizations: Up-to-date Anticipatory guidance/risk factor reduction: Counseled on smoking cessation.  Updated prescription for Chantix sent in.   Meds ordered this encounter  Medications   Varenicline Tartrate, Starter, (CHANTIX STARTING MONTH PAK) 0.5 MG X 11 & 1 MG X 42 TBPK    Sig: Take as directed on packaging.     Dispense:  53 each    Refill:  0   varenicline (CHANTIX CONTINUING MONTH PAK) 1 MG tablet    Sig: Take 1 tablet (1 mg total) by mouth 2 (two) times daily. Continue after completion of starter pack    Dispense:  60 tablet    Refill:  2    No follow-ups on file.    This visit occurred during the SARS-CoV-2 public health emergency.  Safety protocols were in place, including screening questions prior to the visit, additional usage of staff PPE, and extensive cleaning of exam room while observing appropriate  contact time as indicated for disinfecting solutions.

## 2022-05-01 NOTE — Assessment & Plan Note (Signed)
Well adult Recent labs reviewed with her. Screenings: Cologuard ordered Immunizations: Up-to-date Anticipatory guidance/risk factor reduction: Counseled on smoking cessation.  Updated prescription for Chantix sent in.

## 2022-05-01 NOTE — Patient Instructions (Signed)
Preventive Care 40-47 Years Old, Female Preventive care refers to lifestyle choices and visits with your health care provider that can promote health and wellness. Preventive care visits are also called wellness exams. What can I expect for my preventive care visit? Counseling Your health care provider may ask you questions about your: Medical history, including: Past medical problems. Family medical history. Pregnancy history. Current health, including: Menstrual cycle. Method of birth control. Emotional well-being. Home life and relationship well-being. Sexual activity and sexual health. Lifestyle, including: Alcohol, nicotine or tobacco, and drug use. Access to firearms. Diet, exercise, and sleep habits. Work and work environment. Sunscreen use. Safety issues such as seatbelt and bike helmet use. Physical exam Your health care provider will check your: Height and weight. These may be used to calculate your BMI (body mass index). BMI is a measurement that tells if you are at a healthy weight. Waist circumference. This measures the distance around your waistline. This measurement also tells if you are at a healthy weight and may help predict your risk of certain diseases, such as type 2 diabetes and high blood pressure. Heart rate and blood pressure. Body temperature. Skin for abnormal spots. What immunizations do I need?  Vaccines are usually given at various ages, according to a schedule. Your health care provider will recommend vaccines for you based on your age, medical history, and lifestyle or other factors, such as travel or where you work. What tests do I need? Screening Your health care provider may recommend screening tests for certain conditions. This may include: Lipid and cholesterol levels. Diabetes screening. This is done by checking your blood sugar (glucose) after you have not eaten for a while (fasting). Pelvic exam and Pap test. Hepatitis B test. Hepatitis C  test. HIV (human immunodeficiency virus) test. STI (sexually transmitted infection) testing, if you are at risk. Lung cancer screening. Colorectal cancer screening. Mammogram. Talk with your health care provider about when you should start having regular mammograms. This may depend on whether you have a family history of breast cancer. BRCA-related cancer screening. This may be done if you have a family history of breast, ovarian, tubal, or peritoneal cancers. Bone density scan. This is done to screen for osteoporosis. Talk with your health care provider about your test results, treatment options, and if necessary, the need for more tests. Follow these instructions at home: Eating and drinking  Eat a diet that includes fresh fruits and vegetables, whole grains, lean protein, and low-fat dairy products. Take vitamin and mineral supplements as recommended by your health care provider. Do not drink alcohol if: Your health care provider tells you not to drink. You are pregnant, may be pregnant, or are planning to become pregnant. If you drink alcohol: Limit how much you have to 0-1 drink a day. Know how much alcohol is in your drink. In the [place] one drink equals one 12 oz bottle of beer (355 mL), one 5 oz glass of wine (148 mL), or one 1 oz glass of hard liquor (44 mL). Lifestyle Brush your teeth every morning and night with fluoride toothpaste. Floss one time each day. Exercise for at least 30 minutes 5 or more days each week. Do not use any products that contain nicotine or tobacco. These products include cigarettes, chewing tobacco, and vaping devices, such as e-cigarettes. If you need help quitting, ask your health care provider. Do not use drugs. If you are sexually active, practice safe sex. Use a condom or other form of protection to   prevent STIs. If you do not wish to become pregnant, use a form of birth control. If you plan to become pregnant, see your health care provider for a  prepregnancy visit. Take aspirin only as told by your health care provider. Make sure that you understand how much to take and what form to take. Work with your health care provider to find out whether it is safe and beneficial for you to take aspirin daily. Find healthy ways to manage stress, such as: Meditation, yoga, or listening to music. Journaling. Talking to a trusted person. Spending time with friends and family. Minimize exposure to UV radiation to reduce your risk of skin cancer. Safety Always wear your seat belt while driving or riding in a vehicle. Do not drive: If you have been drinking alcohol. Do not ride with someone who has been drinking. When you are tired or distracted. While texting. If you have been using any mind-altering substances or drugs. Wear a helmet and other protective equipment during sports activities. If you have firearms in your house, make sure you follow all gun safety procedures. Seek help if you have been physically or sexually abused. What's next? Visit your health care provider once a year for an annual wellness visit. Ask your health care provider how often you should have your eyes and teeth checked. Stay up to date on all vaccines. This information is not intended to replace advice given to you by your health care provider. Make sure you discuss any questions you have with your health care provider. Document Revised: 03/07/2021 Document Reviewed: 03/07/2021 Elsevier Patient Education  [employer].

## 2022-05-22 LAB — COLOGUARD: COLOGUARD: NEGATIVE

## 2023-04-06 IMAGING — MG MM DIGITAL SCREENING BILAT W/ TOMO AND CAD
8 series · 8 of 24 positions shown · non-contrast
Comparison: Previous exam(s).

CLINICAL DATA: Screening.

EXAM:
DIGITAL SCREENING BILATERAL MAMMOGRAM WITH TOMOSYNTHESIS AND CAD
TECHNIQUE: Bilateral screening digital craniocaudal and mediolateral oblique
mammograms were obtained. Bilateral screening digital breast
tomosynthesis was performed. The images were evaluated with
computer-aided detection.

[R MLO synth-2D]
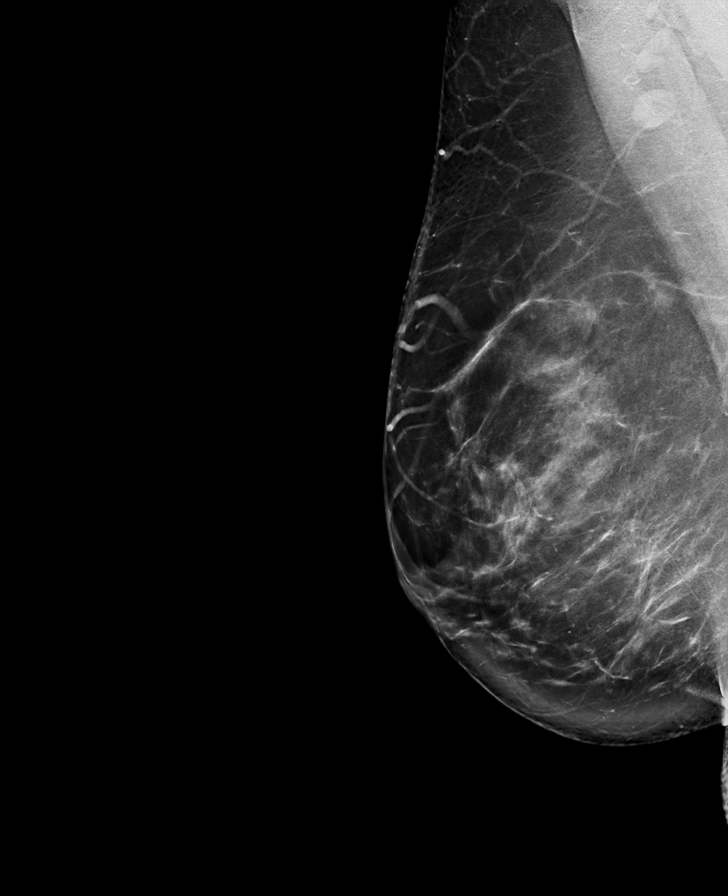

[R CC synth-2D]
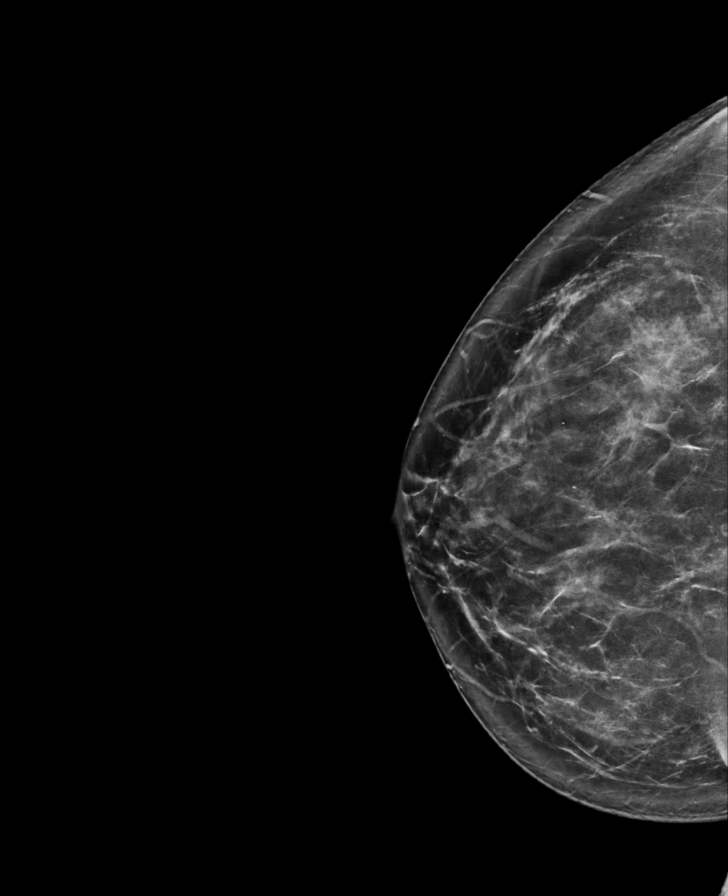

[L MLO synth-2D]
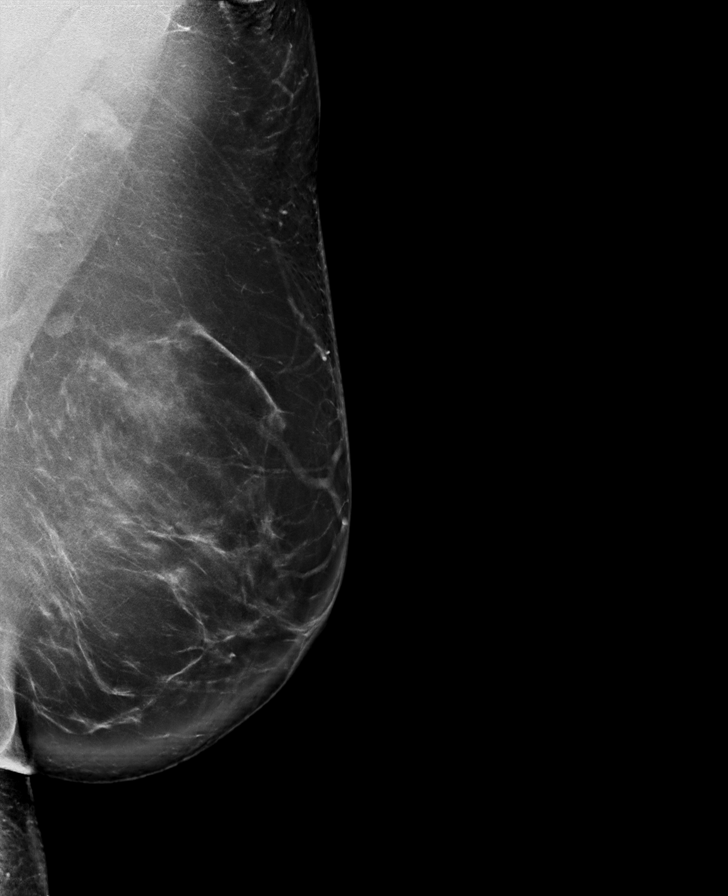

[L CC synth-2D]
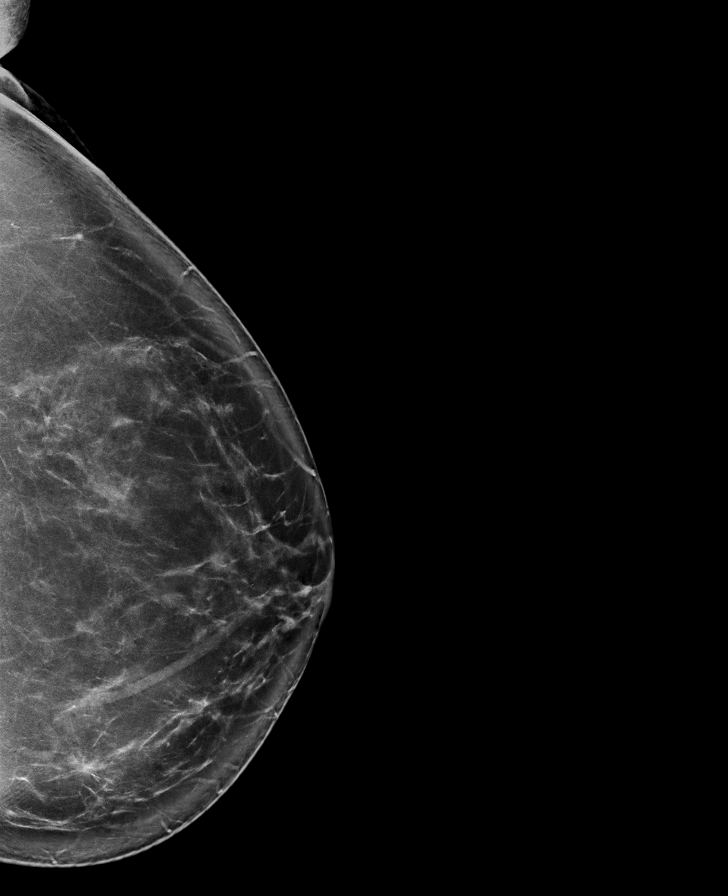

[L MLO tomo · tomo slice 59/116.0]
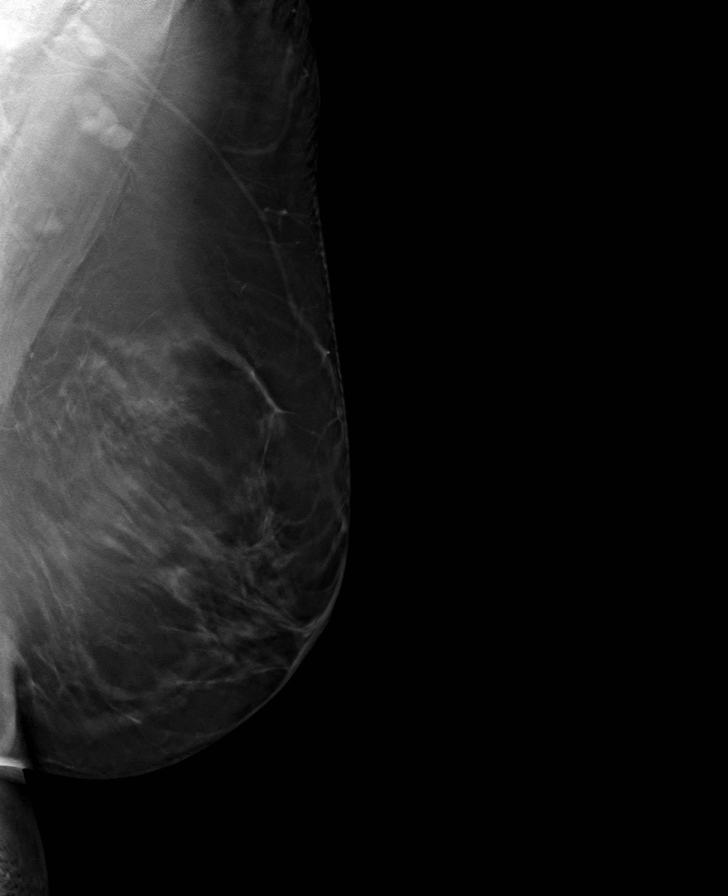

[R MLO tomo · tomo slice 56/111.0]
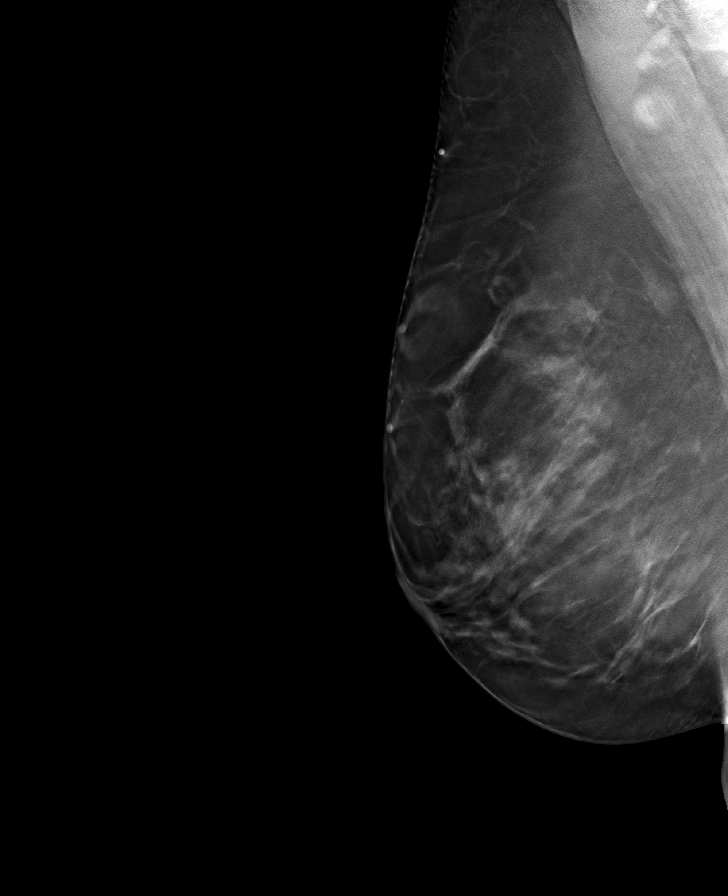

[R CC tomo · tomo slice 44/87.0]
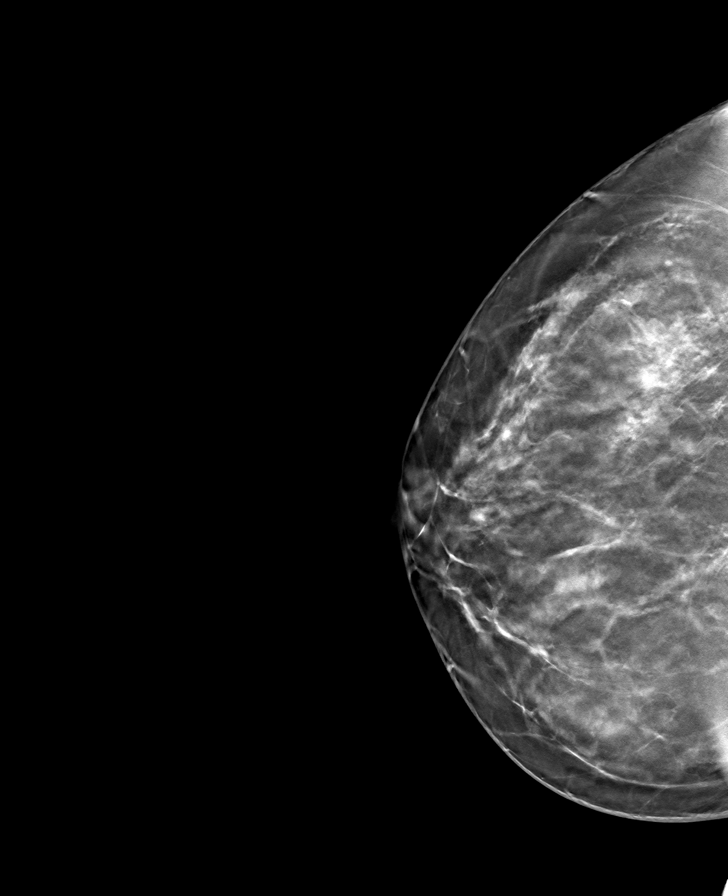

[L CC tomo · tomo slice 51/100.0]
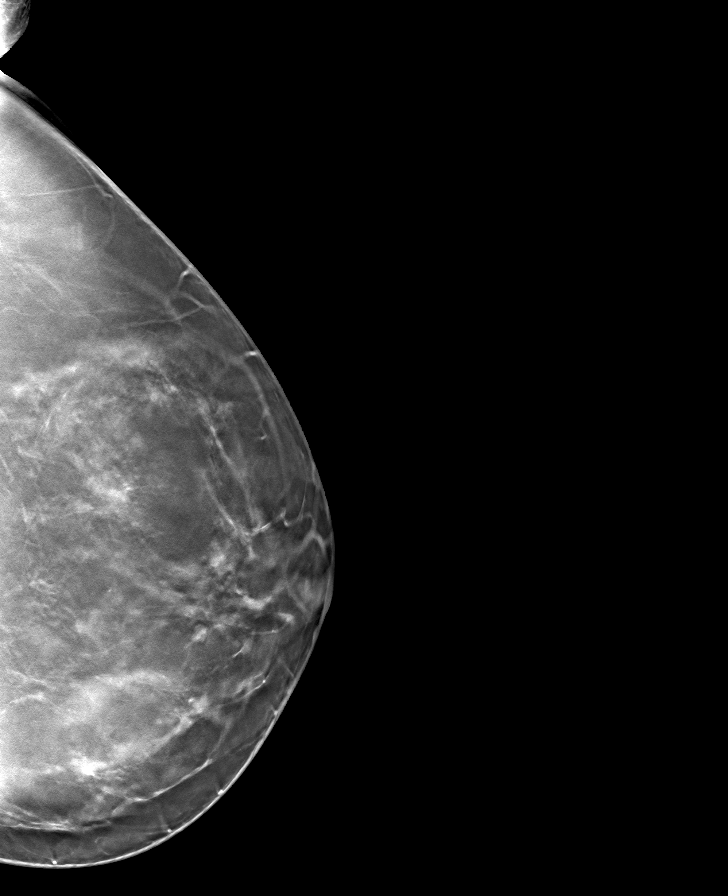

[8 of 24 positions shown; findings below may reference images not displayed]

ACR Breast Density Category c: The breast tissue is heterogeneously
dense, which may obscure small masses.
FINDINGS: There are no findings suspicious for malignancy.
IMPRESSION: No mammographic evidence of malignancy. A result letter of this
screening mammogram will be mailed directly to the patient.

RECOMMENDATION:
Screening mammogram in one year. (Code:[id number]-BC3)

BI-RADS CATEGORY  1: Negative.

## 2023-10-23 ENCOUNTER — Ambulatory Visit (INDEPENDENT_AMBULATORY_CARE_PROVIDER_SITE_OTHER): Payer: No Typology Code available for payment source | Admitting: Family Medicine

## 2023-10-23 ENCOUNTER — Encounter: Payer: Self-pay | Admitting: Family Medicine

## 2023-10-23 VITALS — BP 146/83 | HR 86 | Ht 61.75 in | Wt 192.0 lb

## 2023-10-23 DIAGNOSIS — Z0000 Encounter for general adult medical examination without abnormal findings: Secondary | ICD-10-CM

## 2023-10-23 DIAGNOSIS — E559 Vitamin D deficiency, unspecified: Secondary | ICD-10-CM

## 2023-10-23 DIAGNOSIS — Z720 Tobacco use: Secondary | ICD-10-CM

## 2023-10-23 DIAGNOSIS — Z13220 Encounter for screening for lipoid disorders: Secondary | ICD-10-CM

## 2023-10-23 MED ORDER — BUPROPION HCL ER (XL) 150 MG PO TB24: 150.0000 mg | ORAL_TABLET | Freq: Every day | ORAL | 1 refills | Status: AC

## 2023-10-23 NOTE — Assessment & Plan Note (Signed)
Well adult Orders Placed This Encounter  Procedures   CMP14+EGFR   CBC with Differential/Platelet   Lipid Panel With LDL/HDL Ratio   Vitamin D (25 hydroxy)  Screenings: per lab orders Immunizations: Declines Anticipatory guidance/risk factor reduction: Counseled on smoking cessation.  Using patches.  Adding bupropion.

## 2023-10-23 NOTE — Patient Instructions (Signed)
Preventive Care 49-49 Years Old, Female  Preventive care refers to lifestyle choices and visits with your health care provider that can promote health and wellness. Preventive care visits are also called wellness exams.  What can I expect for my preventive care visit?  Counseling  Your health care provider may ask you questions about your:  Medical history, including:  Past medical problems.  Family medical history.  Pregnancy history.  Current health, including:  Menstrual cycle.  Method of birth control.  Emotional well-being.  Home life and relationship well-being.  Sexual activity and sexual health.  Lifestyle, including:  Alcohol, nicotine or tobacco, and drug use.  Access to firearms.  Diet, exercise, and sleep habits.  Work and work Astronomer.  Sunscreen use.  Safety issues such as seatbelt and bike helmet use.  Physical exam  Your health care provider will check your:  Height and weight. These may be used to calculate your BMI (body mass index). BMI is a measurement that tells if you are at a healthy weight.  Waist circumference. This measures the distance around your waistline. This measurement also tells if you are at a healthy weight and may help predict your risk of certain diseases, such as type 2 diabetes and high blood pressure.  Heart rate and blood pressure.  Body temperature.  Skin for abnormal spots.  What immunizations do I need?    Vaccines are usually given at various ages, according to a schedule. Your health care provider will recommend vaccines for you based on your age, medical history, and lifestyle or other factors, such as travel or where you work.  What tests do I need?  Screening  Your health care provider may recommend screening tests for certain conditions. This may include:  Lipid and cholesterol levels.  Diabetes screening. This is done by checking your blood sugar (glucose) after you have not eaten for a while (fasting).  Pelvic exam and Pap test.  Hepatitis B test.  Hepatitis C  test.  HIV (human immunodeficiency virus) test.  STI (sexually transmitted infection) testing, if you are at risk.  Lung cancer screening.  Colorectal cancer screening.  Mammogram. Talk with your health care provider about when you should start having regular mammograms. This may depend on whether you have a family history of breast cancer.  BRCA-related cancer screening. This may be done if you have a family history of breast, ovarian, tubal, or peritoneal cancers.  Bone density scan. This is done to screen for osteoporosis.  Talk with your health care provider about your test results, treatment options, and if necessary, the need for more tests.  Follow these instructions at home:  Eating and drinking    Eat a diet that includes fresh fruits and vegetables, whole grains, lean protein, and low-fat dairy products.  Take vitamin and mineral supplements as recommended by your health care provider.  Do not drink alcohol if:  Your health care provider tells you not to drink.  You are pregnant, may be pregnant, or are planning to become pregnant.  If you drink alcohol:  Limit how much you have to 0-1 drink a day.  Know how much alcohol is in your drink. In the [place] one drink equals one 12 oz bottle of beer (355 mL), one 5 oz glass of wine (148 mL), or one 1 oz glass of hard liquor (44 mL).  Lifestyle  Brush your teeth every morning and night with fluoride toothpaste. Floss one time each day.  Exercise for at least  30 minutes 5 or more days each week.  Do not use any products that contain nicotine or tobacco. These products include cigarettes, chewing tobacco, and vaping devices, such as e-cigarettes. If you need help quitting, ask your health care provider.  Do not use drugs.  If you are sexually active, practice safe sex. Use a condom or other form of protection to prevent STIs.  If you do not wish to become pregnant, use a form of birth control. If you plan to become pregnant, see your health care provider for a  prepregnancy visit.  Take aspirin only as told by your health care provider. Make sure that you understand how much to take and what form to take. Work with your health care provider to find out whether it is safe and beneficial for you to take aspirin daily.  Find healthy ways to manage stress, such as:  Meditation, yoga, or listening to music.  Journaling.  Talking to a trusted person.  Spending time with friends and family.  Minimize exposure to UV radiation to reduce your risk of skin cancer.  Safety  Always wear your seat belt while driving or riding in a vehicle.  Do not drive:  If you have been drinking alcohol. Do not ride with someone who has been drinking.  When you are tired or distracted.  While texting.  If you have been using any mind-altering substances or drugs.  Wear a helmet and other protective equipment during sports activities.  If you have firearms in your house, make sure you follow all gun safety procedures.  Seek help if you have been physically or sexually abused.  What's next?  Visit your health care provider once a year for an annual wellness visit.  Ask your health care provider how often you should have your eyes and teeth checked.  Stay up to date on all vaccines.  This information is not intended to replace advice given to you by your health care provider. Make sure you discuss any questions you have with your health care provider.  Document Revised: 03/07/2021 Document Reviewed: 03/07/2021  Elsevier Patient Education  2024 [employer].

## 2023-10-23 NOTE — Assessment & Plan Note (Signed)
Did not tolerate chantix well. Adding bupropion in addition to patches.

## 2023-10-23 NOTE — Progress Notes (Signed)
Christine Becker - 48 y.o. female MRN [id number]  Date of birth: 12/24/1975  Subjective Chief Complaint  Patient presents with   Annual Exam    HPI Christine Becker is a 48 y.o. female here today for annual exam.   She reports that she is doing pretty well. .   She is somewhat active and has been working on dietary changes.  History of smoking.  She has been able to cut back to 1/4 ppd. Did not tolerate chantix.  Occasional EtOH use.   Review of Systems  Constitutional:  Negative for chills, fever, malaise/fatigue and weight loss.  HENT:  Negative for congestion, ear pain and sore throat.   Eyes:  Negative for blurred vision, double vision and pain.  Respiratory:  Negative for cough and shortness of breath.   Cardiovascular:  Negative for chest pain and palpitations.  Gastrointestinal:  Negative for abdominal pain, blood in stool, constipation, heartburn and nausea.  Genitourinary:  Negative for dysuria and urgency.  Musculoskeletal:  Negative for joint pain and myalgias.  Neurological:  Negative for dizziness and headaches.  Endo/Heme/Allergies:  Does not bruise/bleed easily.  Psychiatric/Behavioral:  Negative for depression. The patient is not nervous/anxious and does not have insomnia.     No Known Allergies  Past Medical History:  Diagnosis Date   Abnormal Pap smear of cervix    repeat pap WNL   History of HPV infection     Past Surgical History:  Procedure Laterality Date   DILATION AND CURETTAGE OF UTERUS     bleeding   WISDOM TOOTH EXTRACTION      Social History   Socioeconomic History   Marital status: Married    Spouse name: Not on file   Number of children: Not on file   Years of education: Not on file   Highest education level: Bachelor's degree (e.g., BA, AB, BS)  Occupational History   Occupation: HR  Tobacco Use   Smoking status: Every Day    Current packs/day: 0.50    Average packs/day: 0.5 packs/day for 10.0 years (5.0 ttl pk-yrs)    Types: Cigarettes    Smokeless tobacco: Never  Substance and Sexual Activity   Alcohol use: Yes   Drug use: No   Sexual activity: Yes    Partners: Male    Birth control/protection: I.U.D.  Other Topics Concern   Not on file  Social History Narrative   Not on file   Social Drivers of Health   Financial Resource Strain: Low Risk  (10/22/2023)   Overall Financial Resource Strain (CARDIA)    Difficulty of Paying Living Expenses: Not hard at all  Food Insecurity: No Food Insecurity (10/22/2023)   Hunger Vital Sign    Worried About Running Out of Food in the Last Year: Never true    Ran Out of Food in the Last Year: Never true  Transportation Needs: No Transportation Needs (10/22/2023)   PRAPARE - Administrator, Civil Service (Medical): No    Lack of Transportation (Non-Medical): No  Physical Activity: Insufficiently Active (10/22/2023)   Exercise Vital Sign    Days of Exercise per Week: 3 days    Minutes of Exercise per Session: 20 min  Stress: No Stress Concern Present (10/22/2023)   [employer] of Occupational Health - Occupational Stress Questionnaire    Feeling of Stress : Only a little  Social Connections: Socially Integrated (10/22/2023)   Social Connection and Isolation Panel [NHANES]    Frequency of Communication with Friends and  Family: Twice a week    Frequency of Social Gatherings with Friends and Family: Twice a week    Attends Religious Services: More than 4 times per year    Active Member of Clubs or Organizations: Yes    Attends Banker Meetings: 1 to 4 times per year    Marital Status: Married    Family History  Problem Relation Age of Onset   Diabetes Father    Heart failure Father    Obesity Father    Heart disease Father    Hyperlipidemia Mother    Obesity Mother    Cancer Paternal Grandmother        breast   Breast cancer Paternal Grandmother    Stroke Paternal Grandmother    Cancer Paternal Aunt        breast   Cancer Paternal Aunt         breast   Cancer Paternal Aunt        breast   Cancer Cousin        Pat cousin   Breast cancer Cousin    COPD Maternal Grandfather     Health Maintenance  Topic Date Due   Pneumococcal Vaccine 87-20 Years old (1 of 2 - PCV) Never done   HIV Screening  Never done   Hepatitis C Screening  Never done   INFLUENZA VACCINE  04/24/2023   COVID-19 Vaccine (3 - 2024-25 season) 05/25/2023   Fecal DNA (Cologuard)  05/12/2025   Cervical Cancer Screening (HPV/Pap Cotest)  07/17/2025   DTaP/Tdap/Td (2 - Td or Tdap) 05/20/2026   HPV VACCINES  Aged Out     ----------------------------------------------------------------------------------------------------------------------------------------------------------------------------------------------------------------- Physical Exam BP (!) 146/83 (BP Location: Left Arm, Patient Position: Sitting, Cuff Size: Normal)   Pulse 86   Ht 5' 1.75" (1.568 m)   Wt 192 lb (87.1 kg)   SpO2 98%   BMI 35.40 kg/m   Physical Exam Constitutional:      General: She is not in acute distress. HENT:     Head: Normocephalic and atraumatic.     Right Ear: Tympanic membrane and ear canal normal.     Left Ear: Tympanic membrane and ear canal normal.     Nose: Nose normal.  Eyes:     General: No scleral icterus.    Conjunctiva/sclera: Conjunctivae normal.  Neck:     Thyroid: No thyromegaly.  Cardiovascular:     Rate and Rhythm: Normal rate and regular rhythm.     Heart sounds: Normal heart sounds.  Pulmonary:     Effort: Pulmonary effort is normal.     Breath sounds: Normal breath sounds.  Abdominal:     General: Bowel sounds are normal. There is no distension.     Palpations: Abdomen is soft.     Tenderness: There is no abdominal tenderness. There is no guarding.  Musculoskeletal:        General: Normal range of motion.     Cervical back: Normal range of motion and neck supple.  Lymphadenopathy:     Cervical: No cervical adenopathy.  Skin:    General:  Skin is warm and dry.     Findings: No rash.  Neurological:     General: No focal deficit present.     Mental Status: She is alert and oriented to person, place, and time.     Cranial Nerves: No cranial nerve deficit.     Coordination: Coordination normal.  Psychiatric:        Mood and Affect:  Mood normal.        Behavior: Behavior normal.     ------------------------------------------------------------------------------------------------------------------------------------------------------------------------------------------------------------------- Assessment and Plan  Well adult exam Well adult Orders Placed This Encounter  Procedures   CMP14+EGFR   CBC with Differential/Platelet   Lipid Panel With LDL/HDL Ratio   Vitamin D (25 hydroxy)  Screenings: per lab orders Immunizations: Declines Anticipatory guidance/risk factor reduction: Counseled on smoking cessation.  Using patches.  Adding bupropion.   Tobacco use Did not tolerate chantix well. Adding bupropion in addition to patches.    Meds ordered this encounter  Medications   buPROPion (WELLBUTRIN XL) 150 MG 24 hr tablet    Sig: Take 1 tablet (150 mg total) by mouth daily.    Dispense:  30 tablet    Refill:  1    No follow-ups on file.    This visit occurred during the SARS-CoV-2 public health emergency.  Safety protocols were in place, including screening questions prior to the visit, additional usage of staff PPE, and extensive cleaning of exam room while observing appropriate contact time as indicated for disinfecting solutions.

## 2023-10-24 LAB — CMP14+EGFR
ALT: 26 [IU]/L (ref 0–32)
AST: 17 [IU]/L (ref 0–40)
Albumin: 4.4 g/dL (ref 3.9–4.9)
Alkaline Phosphatase: 123 [IU]/L — ABNORMAL HIGH (ref 44–121)
BUN/Creatinine Ratio: 14 (ref 9–23)
BUN: 14 mg/dL (ref 6–24)
Bilirubin Total: 0.2 mg/dL (ref 0.0–1.2)
CO2: 23 mmol/L (ref 20–29)
Calcium: 9.7 mg/dL (ref 8.7–10.2)
Chloride: 103 mmol/L (ref 96–106)
Creatinine, Ser: 0.99 mg/dL (ref 0.57–1.00)
Globulin, Total: 2.4 g/dL (ref 1.5–4.5)
Glucose: 118 mg/dL — ABNORMAL HIGH (ref 70–99)
Potassium: 4.1 mmol/L (ref 3.5–5.2)
Sodium: 140 mmol/L (ref 134–144)
Total Protein: 6.8 g/dL (ref 6.0–8.5)
eGFR: 70 mL/min/{1.73_m2} (ref >59)

## 2023-10-24 LAB — VITAMIN D 25 HYDROXY (VIT D DEFICIENCY, FRACTURES): Vit D, 25-Hydroxy: 21 ng/mL — ABNORMAL LOW (ref 30.0–100.0)

## 2023-10-24 LAB — CBC WITH DIFFERENTIAL/PLATELET
Basophils Absolute: 0.1 10*3/uL (ref 0.0–0.2)
Basos: 1 %
EOS (ABSOLUTE): 0.1 10*3/uL (ref 0.0–0.4)
Eos: 1 %
Hematocrit: 43.7 % (ref 34.0–46.6)
Hemoglobin: 14.7 g/dL (ref 11.1–15.9)
Immature Grans (Abs): 0.1 10*3/uL (ref 0.0–0.1)
Immature Granulocytes: 1 %
Lymphocytes Absolute: 1.8 10*3/uL (ref 0.7–3.1)
Lymphs: 19 %
MCH: 32.5 pg (ref 26.6–33.0)
MCHC: 33.6 g/dL (ref 31.5–35.7)
MCV: 97 fL (ref 79–97)
Monocytes Absolute: 0.6 10*3/uL (ref 0.1–0.9)
Monocytes: 6 %
Neutrophils Absolute: 7 10*3/uL (ref 1.4–7.0)
Neutrophils: 72 %
Platelets: 300 10*3/uL (ref 150–450)
RBC: 4.53 x10E6/uL (ref 3.77–5.28)
RDW: 12.8 % (ref 11.7–15.4)
WBC: 9.6 10*3/uL (ref 3.4–10.8)

## 2023-10-24 LAB — LIPID PANEL WITH LDL/HDL RATIO
Cholesterol, Total: 182 mg/dL (ref 100–199)
HDL: 49 mg/dL (ref >39)
LDL Chol Calc (NIH): 113 mg/dL — ABNORMAL HIGH (ref 0–99)
LDL/HDL Ratio: 2.3 {ratio} (ref 0.0–3.2)
Triglycerides: 110 mg/dL (ref 0–149)
VLDL Cholesterol Cal: 20 mg/dL (ref 5–40)

## 2023-11-07 ENCOUNTER — Encounter: Payer: Self-pay | Admitting: Family Medicine

## 2023-12-10 ENCOUNTER — Ambulatory Visit: Payer: Self-pay

## 2023-12-10 DIAGNOSIS — Z1231 Encounter for screening mammogram for malignant neoplasm of breast: Secondary | ICD-10-CM | POA: Diagnosis not present

## 2023-12-15 ENCOUNTER — Encounter: Payer: Self-pay | Admitting: Obstetrics & Gynecology
